# Patient Record
Sex: Female | Born: 1965 | Hispanic: No | Marital: Married | Smoking: Never smoker
Health system: Southern US, Community
[De-identification: ages and names within clinical notes are randomized; demographics above are authoritative.]

## PROBLEM LIST (undated history)

## (undated) DIAGNOSIS — I10 Essential (primary) hypertension: Secondary | ICD-10-CM

## (undated) DIAGNOSIS — R202 Paresthesia of skin: Secondary | ICD-10-CM

## (undated) DIAGNOSIS — R200 Anesthesia of skin: Secondary | ICD-10-CM

## (undated) HISTORY — DX: Anesthesia of skin: R20.2

## (undated) HISTORY — DX: Anesthesia of skin: R20.0

---

## 2001-05-15 HISTORY — PX: OTHER SURGICAL HISTORY: SHX169

## 2011-01-30 ENCOUNTER — Other Ambulatory Visit: Payer: Self-pay | Admitting: Family Medicine

## 2011-01-30 DIAGNOSIS — Z139 Encounter for screening, unspecified: Secondary | ICD-10-CM

## 2011-01-30 DIAGNOSIS — Z1231 Encounter for screening mammogram for malignant neoplasm of breast: Secondary | ICD-10-CM

## 2011-03-28 ENCOUNTER — Ambulatory Visit
Admission: RE | Admit: 2011-03-28 | Discharge: 2011-03-28 | Disposition: A | Discharge status: Home / Self Care | Payer: Managed Care, Other (non HMO) | Source: Ambulatory Visit | Attending: Family Medicine | Admitting: Family Medicine

## 2011-03-28 DIAGNOSIS — Z139 Encounter for screening, unspecified: Secondary | ICD-10-CM

## 2011-03-28 DIAGNOSIS — Z1231 Encounter for screening mammogram for malignant neoplasm of breast: Secondary | ICD-10-CM

## 2012-03-12 ENCOUNTER — Other Ambulatory Visit: Payer: Self-pay | Admitting: Family Medicine

## 2012-03-12 DIAGNOSIS — Z139 Encounter for screening, unspecified: Secondary | ICD-10-CM

## 2012-03-12 DIAGNOSIS — Z1231 Encounter for screening mammogram for malignant neoplasm of breast: Secondary | ICD-10-CM

## 2012-03-28 ENCOUNTER — Ambulatory Visit
Admission: RE | Admit: 2012-03-28 | Discharge: 2012-03-28 | Disposition: A | Discharge status: Home / Self Care | Payer: Managed Care, Other (non HMO) | Source: Ambulatory Visit | Attending: Family Medicine | Admitting: Family Medicine

## 2012-03-28 DIAGNOSIS — Z139 Encounter for screening, unspecified: Secondary | ICD-10-CM

## 2012-04-17 ENCOUNTER — Ambulatory Visit
Admission: RE | Admit: 2012-04-17 | Discharge: 2012-04-17 | Disposition: A | Discharge status: Home / Self Care | Payer: Managed Care, Other (non HMO) | Source: Ambulatory Visit | Attending: Family Medicine | Admitting: Family Medicine

## 2012-04-17 DIAGNOSIS — Z1231 Encounter for screening mammogram for malignant neoplasm of breast: Secondary | ICD-10-CM

## 2013-03-05 ENCOUNTER — Other Ambulatory Visit: Payer: Self-pay | Admitting: Internal Medicine

## 2013-03-05 DIAGNOSIS — Z0000 Encounter for general adult medical examination without abnormal findings: Secondary | ICD-10-CM

## 2013-03-07 ENCOUNTER — Other Ambulatory Visit: Payer: Self-pay | Admitting: Internal Medicine

## 2013-03-07 DIAGNOSIS — Z1231 Encounter for screening mammogram for malignant neoplasm of breast: Secondary | ICD-10-CM

## 2013-04-02 ENCOUNTER — Ambulatory Visit
Admission: RE | Admit: 2013-04-02 | Discharge: 2013-04-02 | Disposition: A | Discharge status: Home / Self Care | Payer: Managed Care, Other (non HMO) | Source: Ambulatory Visit | Attending: Internal Medicine | Admitting: Internal Medicine

## 2013-04-02 DIAGNOSIS — Z0000 Encounter for general adult medical examination without abnormal findings: Secondary | ICD-10-CM

## 2013-04-18 ENCOUNTER — Ambulatory Visit
Admission: RE | Admit: 2013-04-18 | Discharge: 2013-04-18 | Disposition: A | Discharge status: Home / Self Care | Payer: Managed Care, Other (non HMO) | Source: Ambulatory Visit | Attending: Internal Medicine | Admitting: Internal Medicine

## 2013-04-18 DIAGNOSIS — Z1231 Encounter for screening mammogram for malignant neoplasm of breast: Secondary | ICD-10-CM

## 2013-11-09 IMAGING — RF DG ESOPHAGUS
9 series · 9 of 9 positions shown · non-contrast
Comparison: None.

CLINICAL DATA: Screening exam

ESOPHOGRAM/BARIUM SWALLOW
TECHNIQUE: Combined double contrast and single contrast
examination performed using effervescent crystals, thick barium
liquid, and thin barium liquid.
Fluoroscopy time:  1.3 minutes.

[Series 1: run · 1 of 1 slices shown (1 of 9)]
[im 1/1]
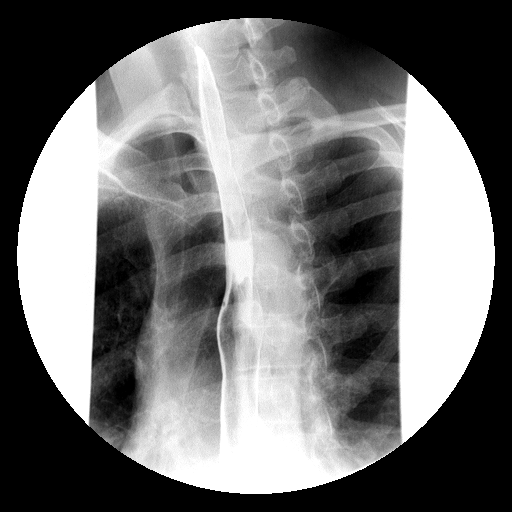

[Series 2: run · 1 of 1 slices shown (2 of 9)]
[im 1/1]
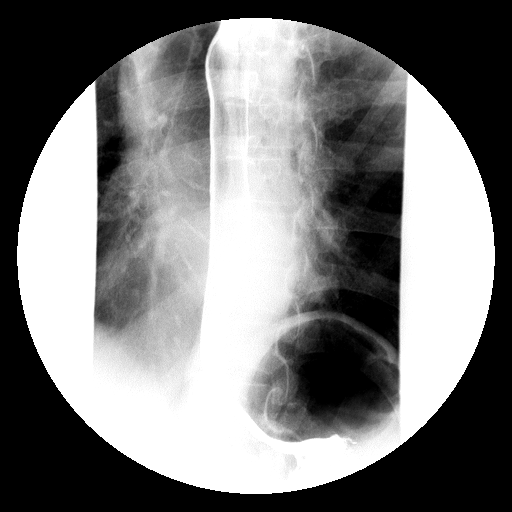

[Series 3: run · 1 of 1 slices shown (3 of 9)]
[im 1/1]
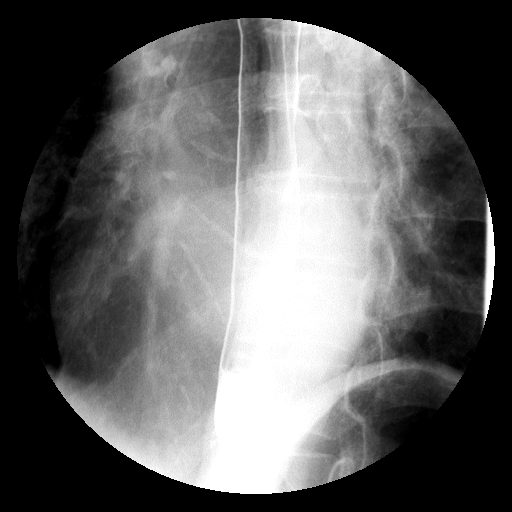

[Series 4: run · 1 of 1 slices shown (4 of 9)]
[im 1/1]
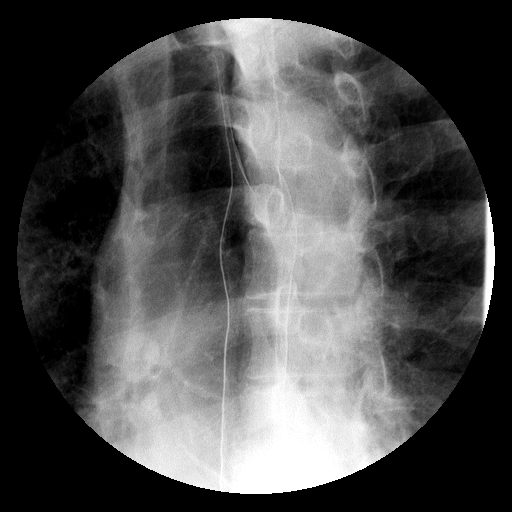

[Series 5: run · 1 of 1 slices shown (5 of 9)]
[im 1/1]
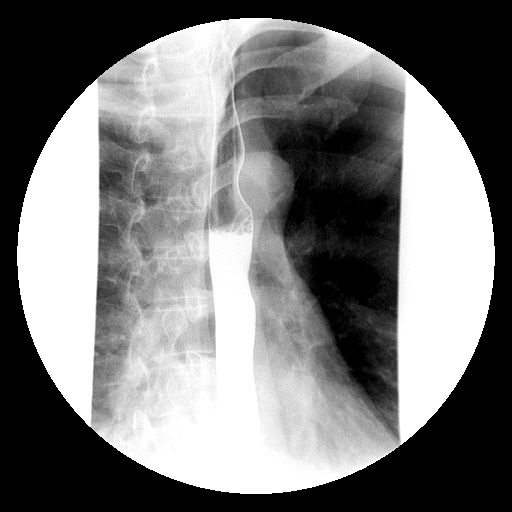

[Series 6: run · 1 of 1 slices shown (6 of 9)]
[im 1/1]
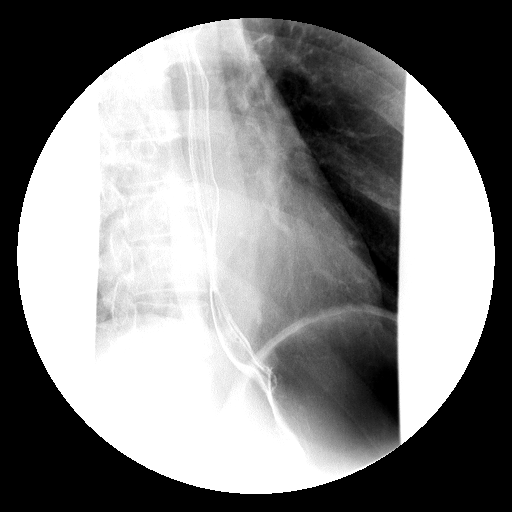

[Series 7: run · 1 of 1 slices shown (7 of 9)]
[im 1/1]
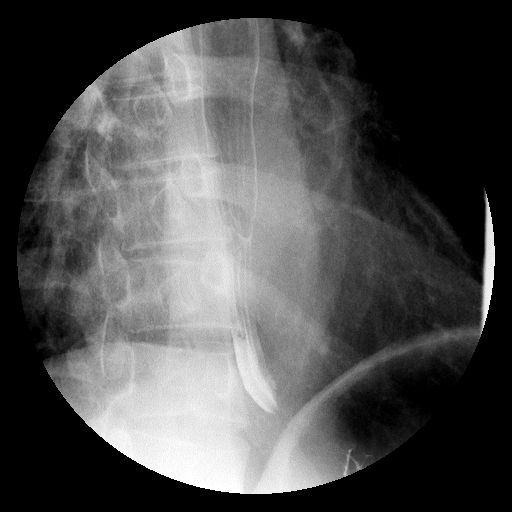

[Series 8: run · 1 of 1 slices shown (8 of 9)]
[im 1/1]
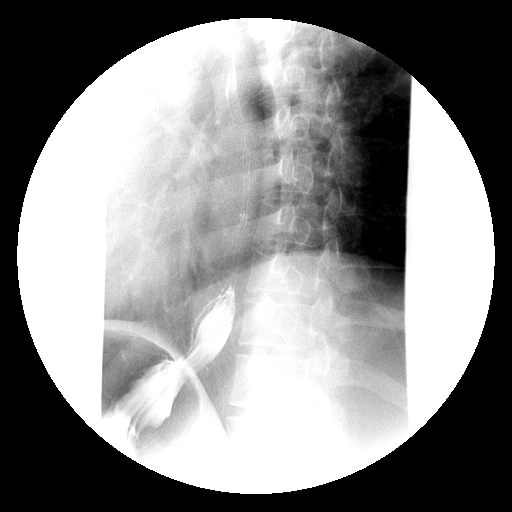

[Series 9: run · 1 of 1 slices shown (9 of 9)]
[im 1/1]
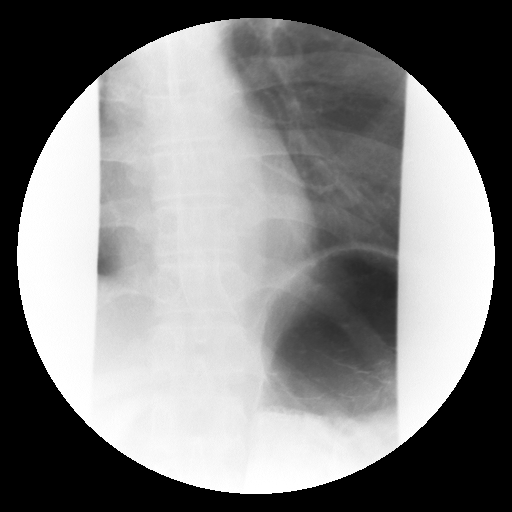

[9 of 9 positions shown; findings below may reference images not displayed]

FINDINGS: No mucosal irregularity within the thoracic esophagus or
distal esophagus.  No stricture or mass at the gastroesophageal
junction.  Normal esophageal motility.  No gastroesophageal reflux.
No hiatal hernia.  13 mm barium tablet passed GE junction easily.
IMPRESSION: Normal esophagram.

## 2014-03-11 ENCOUNTER — Other Ambulatory Visit: Payer: Self-pay | Admitting: Family Medicine

## 2014-03-11 DIAGNOSIS — Z0000 Encounter for general adult medical examination without abnormal findings: Secondary | ICD-10-CM

## 2014-03-11 DIAGNOSIS — Z1231 Encounter for screening mammogram for malignant neoplasm of breast: Secondary | ICD-10-CM

## 2014-03-20 ENCOUNTER — Other Ambulatory Visit: Payer: Self-pay | Admitting: Family Medicine

## 2014-03-20 DIAGNOSIS — M8580 Other specified disorders of bone density and structure, unspecified site: Secondary | ICD-10-CM

## 2014-04-21 ENCOUNTER — Other Ambulatory Visit: Payer: Managed Care, Other (non HMO)

## 2014-04-22 ENCOUNTER — Other Ambulatory Visit: Payer: Managed Care, Other (non HMO)

## 2014-04-22 ENCOUNTER — Ambulatory Visit: Payer: Managed Care, Other (non HMO)

## 2014-06-08 ENCOUNTER — Other Ambulatory Visit: Payer: Managed Care, Other (non HMO)

## 2014-06-09 ENCOUNTER — Ambulatory Visit: Payer: Managed Care, Other (non HMO)

## 2014-06-09 ENCOUNTER — Other Ambulatory Visit: Payer: Managed Care, Other (non HMO)

## 2014-06-24 ENCOUNTER — Ambulatory Visit
Admission: RE | Admit: 2014-06-24 | Discharge: 2014-06-24 | Disposition: A | Discharge status: Home / Self Care | Payer: Managed Care, Other (non HMO) | Source: Ambulatory Visit | Attending: Family Medicine | Admitting: Family Medicine

## 2014-06-24 DIAGNOSIS — Z0000 Encounter for general adult medical examination without abnormal findings: Secondary | ICD-10-CM

## 2014-06-26 ENCOUNTER — Other Ambulatory Visit: Payer: Self-pay | Admitting: Family Medicine

## 2014-07-06 ENCOUNTER — Other Ambulatory Visit: Payer: Self-pay | Admitting: Family Medicine

## 2014-07-06 DIAGNOSIS — M8580 Other specified disorders of bone density and structure, unspecified site: Secondary | ICD-10-CM

## 2014-07-08 ENCOUNTER — Ambulatory Visit
Admission: RE | Admit: 2014-07-08 | Discharge: 2014-07-08 | Disposition: A | Discharge status: Home / Self Care | Payer: 59 | Source: Ambulatory Visit | Attending: Family Medicine | Admitting: Family Medicine

## 2014-07-08 DIAGNOSIS — Z1231 Encounter for screening mammogram for malignant neoplasm of breast: Secondary | ICD-10-CM

## 2014-07-08 DIAGNOSIS — M8580 Other specified disorders of bone density and structure, unspecified site: Secondary | ICD-10-CM

## 2015-06-07 ENCOUNTER — Other Ambulatory Visit: Payer: Self-pay

## 2015-06-07 ENCOUNTER — Other Ambulatory Visit: Payer: Self-pay | Admitting: Family Medicine

## 2015-06-07 DIAGNOSIS — Z1231 Encounter for screening mammogram for malignant neoplasm of breast: Secondary | ICD-10-CM

## 2015-06-07 DIAGNOSIS — Z85028 Personal history of other malignant neoplasm of stomach: Secondary | ICD-10-CM

## 2015-06-23 ENCOUNTER — Other Ambulatory Visit: Payer: 59

## 2015-07-14 ENCOUNTER — Ambulatory Visit
Admission: RE | Admit: 2015-07-14 | Discharge: 2015-07-14 | Disposition: A | Discharge status: Home / Self Care | Payer: 59 | Source: Ambulatory Visit

## 2015-07-14 DIAGNOSIS — Z1231 Encounter for screening mammogram for malignant neoplasm of breast: Secondary | ICD-10-CM

## 2015-07-21 ENCOUNTER — Ambulatory Visit
Admission: RE | Admit: 2015-07-21 | Discharge: 2015-07-21 | Disposition: A | Discharge status: Home / Self Care | Payer: 59 | Source: Ambulatory Visit | Attending: Family Medicine | Admitting: Family Medicine

## 2015-07-21 DIAGNOSIS — Z85028 Personal history of other malignant neoplasm of stomach: Secondary | ICD-10-CM

## 2015-09-22 ENCOUNTER — Emergency Department (HOSPITAL_COMMUNITY): Payer: Managed Care, Other (non HMO)

## 2015-09-22 ENCOUNTER — Emergency Department (HOSPITAL_COMMUNITY)
Admission: EM | Admit: 2015-09-22 | Discharge: 2015-09-22 | Disposition: A | Discharge status: Home / Self Care | Payer: Managed Care, Other (non HMO) | Attending: Emergency Medicine | Admitting: Emergency Medicine

## 2015-09-22 ENCOUNTER — Encounter (HOSPITAL_COMMUNITY): Payer: Self-pay | Admitting: Emergency Medicine

## 2015-09-22 DIAGNOSIS — M79601 Pain in right arm: Secondary | ICD-10-CM

## 2015-09-22 DIAGNOSIS — R51 Headache: Secondary | ICD-10-CM | POA: Diagnosis present

## 2015-09-22 DIAGNOSIS — R202 Paresthesia of skin: Secondary | ICD-10-CM

## 2015-09-22 DIAGNOSIS — M79621 Pain in right upper arm: Secondary | ICD-10-CM | POA: Diagnosis not present

## 2015-09-22 DIAGNOSIS — M79622 Pain in left upper arm: Secondary | ICD-10-CM | POA: Insufficient documentation

## 2015-09-22 DIAGNOSIS — R519 Headache, unspecified: Secondary | ICD-10-CM

## 2015-09-22 DIAGNOSIS — M79602 Pain in left arm: Secondary | ICD-10-CM

## 2015-09-22 DIAGNOSIS — I10 Essential (primary) hypertension: Secondary | ICD-10-CM | POA: Diagnosis not present

## 2015-09-22 DIAGNOSIS — R209 Unspecified disturbances of skin sensation: Secondary | ICD-10-CM | POA: Insufficient documentation

## 2015-09-22 DIAGNOSIS — Z0389 Encounter for observation for other suspected diseases and conditions ruled out: Secondary | ICD-10-CM | POA: Diagnosis not present

## 2015-09-22 HISTORY — DX: Essential (primary) hypertension: I10

## 2015-09-22 LAB — COMPREHENSIVE METABOLIC PANEL
ALT: 11 U/L — ABNORMAL LOW (ref 14–54)
AST: 15 U/L (ref 15–41)
Albumin: 4.4 g/dL (ref 3.5–5.0)
Alkaline Phosphatase: 43 U/L (ref 38–126)
Anion gap: 10 (ref 5–15)
BUN: 10 mg/dL (ref 6–20)
CHLORIDE: 104 mmol/L (ref 101–111)
CO2: 28 mmol/L (ref 22–32)
Calcium: 9 mg/dL (ref 8.9–10.3)
Creatinine, Ser: 0.57 mg/dL (ref 0.44–1.00)
Glucose, Bld: 101 mg/dL — ABNORMAL HIGH (ref 65–99)
POTASSIUM: 3.8 mmol/L (ref 3.5–5.1)
Sodium: 142 mmol/L (ref 135–145)
Total Bilirubin: 1.3 mg/dL — ABNORMAL HIGH (ref 0.3–1.2)
Total Protein: 6.6 g/dL (ref 6.5–8.1)

## 2015-09-22 LAB — URINALYSIS, ROUTINE W REFLEX MICROSCOPIC
Bilirubin Urine: NEGATIVE
Glucose, UA: NEGATIVE mg/dL
HGB URINE DIPSTICK: NEGATIVE
KETONES UR: NEGATIVE mg/dL
Leukocytes, UA: NEGATIVE
Nitrite: NEGATIVE
PROTEIN: NEGATIVE mg/dL
Specific Gravity, Urine: 1.004 — ABNORMAL LOW (ref 1.005–1.030)
pH: 7.5 (ref 5.0–8.0)

## 2015-09-22 LAB — CBC
HEMATOCRIT: 36.9 % (ref 36.0–46.0)
Hemoglobin: 12.8 g/dL (ref 12.0–15.0)
MCH: 31.2 pg (ref 26.0–34.0)
MCHC: 34.7 g/dL (ref 30.0–36.0)
MCV: 90 fL (ref 78.0–100.0)
Platelets: 250 10*3/uL (ref 150–400)
RBC: 4.1 MIL/uL (ref 3.87–5.11)
RDW: 13.3 % (ref 11.5–15.5)
WBC: 4.8 10*3/uL (ref 4.0–10.5)

## 2015-09-22 LAB — PROTIME-INR
INR: 1.03 (ref 0.00–1.49)
PROTHROMBIN TIME: 13.7 s (ref 11.6–15.2)

## 2015-09-22 LAB — I-STAT CHEM 8, ED
BUN: 8 mg/dL (ref 6–20)
CALCIUM ION: 1.14 mmol/L (ref 1.12–1.23)
CHLORIDE: 100 mmol/L — AB (ref 101–111)
Creatinine, Ser: 0.5 mg/dL (ref 0.44–1.00)
Glucose, Bld: 93 mg/dL (ref 65–99)
HCT: 42 % (ref 36.0–46.0)
HEMOGLOBIN: 14.3 g/dL (ref 12.0–15.0)
Potassium: 3.7 mmol/L (ref 3.5–5.1)
SODIUM: 140 mmol/L (ref 135–145)
TCO2: 26 mmol/L (ref 0–100)

## 2015-09-22 LAB — DIFFERENTIAL
BASOS ABS: 0.1 10*3/uL (ref 0.0–0.1)
BASOS PCT: 1 %
EOS ABS: 0.2 10*3/uL (ref 0.0–0.7)
Eosinophils Relative: 4 %
Lymphocytes Relative: 30 %
Lymphs Abs: 1.5 10*3/uL (ref 0.7–4.0)
MONOS PCT: 8 %
Monocytes Absolute: 0.4 10*3/uL (ref 0.1–1.0)
Neutro Abs: 2.8 10*3/uL (ref 1.7–7.7)
Neutrophils Relative %: 57 %

## 2015-09-22 LAB — I-STAT TROPONIN, ED: TROPONIN I, POC: 0 ng/mL (ref 0.00–0.08)

## 2015-09-22 LAB — ETHANOL

## 2015-09-22 LAB — RAPID URINE DRUG SCREEN, HOSP PERFORMED
Amphetamines: NOT DETECTED
BARBITURATES: NOT DETECTED
BENZODIAZEPINES: NOT DETECTED
Cocaine: NOT DETECTED
OPIATES: NOT DETECTED
TETRAHYDROCANNABINOL: NOT DETECTED

## 2015-09-22 LAB — APTT: aPTT: 33 seconds (ref 24–37)

## 2015-09-22 MED ORDER — ACETAMINOPHEN 325 MG PO TABS
650.0000 mg | ORAL_TABLET | Freq: Once | ORAL | Status: AC
  Administered 2015-09-22: 650 mg via ORAL
  Filled 2015-09-22: qty 2

## 2015-09-22 NOTE — ED Notes (Signed)
Patient transported to MRI 

## 2015-09-22 NOTE — ED Notes (Signed)
Pt reports HA for past week associated with tingling in bilateral hands and legs. Tingling worse in R leg. Also having extremity weakness. Sent over from PCP for eval. No known head injuries.

## 2015-09-22 NOTE — ED Notes (Signed)
Patient ambulated from triage to room without difficulty. 

## 2015-09-22 NOTE — ED Provider Notes (Signed)
CSN: [id number]     Arrival date & time 09/22/15  1532 History   First MD Initiated Contact with Patient 09/22/15 1612     Chief Complaint  Patient presents with  . Headache  . Extremity Weakness     (Consider location/radiation/quality/duration/timing/severity/associated sxs/prior Treatment) HPI Comments: Patient presents today from the office of her PCP to rule out CVA.  Patient reports that she has had a headache for the past 3-4 days.  Headache has been constant and is diffuse.  Headache gradual in onset.  She has been having intermittent headaches for the past month.  She states that she has been taking Tylenol for the headache, which helps temporarily.  She reports having similar headaches in the past.  She also reports that she has been having some tingling of both feet and also both hands over the past four days.  Tingling is worse on the right foot than on the left.  She reports a history of headaches, but states that she has never had tingling like this before with previous headaches.  She denies any acute head injury or trauma.  She also reports that she had some generalized weakness two days ago, but denies weakness at this time.  Denies any fever, chills, vision changes, difficulty swallowing, confusion, facial asymmetry, dizziness, syncope, back pain, neck pain, chest pain, or any other symptoms at this time.  No history of CVA or TIA.  She does have a history of HTN.  No history of Hyperlipidemia or DM.  She does not smoke.  No family history of CVA.  The history is provided by the patient.    Past Medical History  Diagnosis Date  . Hypertension    History reviewed. No pertinent past surgical history. History reviewed. No pertinent family history. Social History  Substance Use Topics  . Smoking status: Never Smoker   . Smokeless tobacco: None  . Alcohol Use: No   OB History    No data available     Review of Systems  All other systems reviewed and are  negative.     Allergies  Review of patient's allergies indicates no known allergies.  Home Medications   Prior to Admission medications   Not on File   BP 161/101 mmHg  Pulse 70  Temp(Src) 98.2 F (36.8 C) (Oral)  Resp 16  Ht 5' (1.524 m)  Wt 58 kg  BMI 24.97 kg/m2  SpO2 96% Physical Exam  Constitutional: She appears well-developed and well-nourished.  HENT:  Head: Normocephalic and atraumatic.  Mouth/Throat: Oropharynx is clear and moist.  Eyes: EOM are normal. Pupils are equal, round, and reactive to light.  Neck: Normal range of motion. Neck supple.  Cardiovascular: Normal rate, regular rhythm and normal heart sounds.   Pulmonary/Chest: Effort normal and breath sounds normal.  Musculoskeletal: Normal range of motion.  Neurological: She is alert. She has normal strength. No cranial nerve deficit or sensory deficit. Coordination and gait normal.  Normal finger to nose testing, no dysmetria Normal sensation of upper and lower extremities Normal gait, no ataxia  Skin: Skin is warm and dry.  Psychiatric: She has a normal mood and affect.  Nursing note and vitals reviewed.   ED Course  Procedures (including critical care time) Labs Review Labs Reviewed  ETHANOL  PROTIME-INR  APTT  CBC  DIFFERENTIAL  COMPREHENSIVE METABOLIC PANEL  URINE RAPID DRUG SCREEN, HOSP PERFORMED  URINALYSIS, ROUTINE W REFLEX MICROSCOPIC (NOT AT [facility])  I-STAT CHEM 8, ED  Rosezena SensorI-STAT TROPOININ, ED  Imaging Review Ct Head Wo Contrast  09/22/2015  CLINICAL DATA:  Headache for approximately 1 week with tingling in both hands and legs. Initial encounter. EXAM: CT HEAD WITHOUT CONTRAST TECHNIQUE: Contiguous axial images were obtained from the base of the skull through the vertex without intravenous contrast. COMPARISON:  None. FINDINGS: The brain appears normal without hemorrhage, infarct, mass lesion, mass effect, midline shift or abnormal extra-axial fluid collection. No hydrocephalus or  pneumocephalus. Benign osteoma right frontal calvarium is noted. The calvarium is intact. Imaged paranasal sinuses and mastoid air cells are clear. IMPRESSION: Negative head CT. Electronically Signed   By: Drusilla Kanner M.D.   On: 09/22/2015 17:18   Mr Brain Wo Contrast  09/22/2015  CLINICAL DATA:  Left-sided weakness. Headache with tingling in both hands in legs. Tingling is worse on the right lower extremity. Right-sided numbness. EXAM: MRI HEAD WITHOUT CONTRAST TECHNIQUE: Multiplanar, multiecho pulse sequences of the brain and surrounding structures were obtained without intravenous contrast. COMPARISON:  CT head without contrast from the same day. FINDINGS: The diffusion-weighted images demonstrate no evidence for acute or subacute infarction. No acute hemorrhage or mass lesion is present. No significant white matter disease is present. The internal auditory canals are within normal limits bilaterally. The brainstem and cerebellum are normal. Flow is present in the major intracranial arteries. The globes and orbits are intact. A fluid level is present inferiorly in the left maxillary sinus. Mucosal thickening is present bilaterally. A calcified 1.5 cm right frontal scalp lesion likely represents a sebaceous cyst. The skullbase is within normal limits. Midline sagittal images are unremarkable. IMPRESSION: 1. Normal MRI appearance of the brain. 2. 1.5 cm right frontal scalp lesion likely reflecting a sebaceous cyst. Electronically Signed   By: Marin Roberts M.D.   On: 09/22/2015 18:40   I have personally reviewed and evaluated these images and lab results as part of my medical decision-making.   EKG Interpretation None     7:05 PM Patient reports that her headache resolved after given the Tylenol. MDM   Final diagnoses:  None   Patient presents today with complaint of headache and also numbness/tingling of extremities, worse on the right.  She was seen by PCP earlier today and sent to  the ED to rule out a CVA.  Normal neurological exam at this time.  Labs unremarkable.  CT head is negative. MRI is also negative.  Headache resolved in the ED after given Tylenol.  Feel that the patient is stable for discharge.  Return precautions given.    Santiago Glad, PA-C 09/22/15 2300  Santiago Glad, PA-C 09/22/15 1610  Bethann Berkshire, MD 09/23/15 [phone]

## 2015-09-22 NOTE — ED Notes (Signed)
PCP office called to alert us that they were sending over a patient. Headache, blurred vision, numbness, tingling x 1 week. Hx of HTN, does not take medication. Rule out CVA, alerted triage nurse.

## 2015-09-22 NOTE — Progress Notes (Signed)
Confirmed pcp seen today was Dr Pearson GrippeJames Kim  EPIC updated

## 2015-09-22 NOTE — ED Notes (Signed)
Patient aware of urine sample. Patient states she cannot void at this time.

## 2015-10-20 ENCOUNTER — Encounter: Payer: Self-pay | Admitting: Neurology

## 2015-10-20 ENCOUNTER — Ambulatory Visit (INDEPENDENT_AMBULATORY_CARE_PROVIDER_SITE_OTHER): Payer: Managed Care, Other (non HMO) | Admitting: Neurology

## 2015-10-20 VITALS — BP 135/91 | HR 69 | Ht 60.0 in | Wt 127.8 lb

## 2015-10-20 DIAGNOSIS — M25541 Pain in joints of right hand: Secondary | ICD-10-CM | POA: Diagnosis not present

## 2015-10-20 DIAGNOSIS — R202 Paresthesia of skin: Secondary | ICD-10-CM | POA: Insufficient documentation

## 2015-10-20 MED ORDER — DICLOFENAC SODIUM 1 % TD GEL: 4.0000 g | Freq: Four times a day (QID) | TRANSDERMAL | Status: AC

## 2015-10-20 NOTE — Progress Notes (Signed)
PATIENT: Colleen Hawkins DOB: 1965-10-11  Chief Complaint  Patient presents with  . Numbness    She is concerned about intermittent numbness, tingling and pain in her bilateral arms, hands and several fingers (right side worse than left).  She has also experienced numbness in her legs.     HISTORICAL  Colleen Hawkins  Is a 50 years right-handed female, seen in refer by her primary care physician Dr. Pearson Grippe for evaluation of intermittent bilateral hands and arm paresthesia.  She had a history of hypertension, currently is a housewife, also take an like courses, enjoy playing piano.  She first noticed right ring finger pain around April 2017, she woke up notice right fourth metaphalangeal joints tenderness, difficult to make a tight grip, she also noticed intermittent first 3 fingertips paresthesia, right wrist stiffness, achy pain  She also has mild right-sided neck pain, right shoulder pain, subjective weakness, she denies left arm involvement, no gait abnormality.  Most recent laboratory evaluations in May 2017 from [facility], CBC, CMP, lipid panel were normal.   REVIEW OF SYSTEMS: Full 14 system review of systems performed and notable only for as above  ALLERGIES: No Known Allergies  HOME MEDICATIONS: Current Outpatient Prescriptions  Medication Sig Dispense Refill  . acetaminophen (TYLENOL) 500 MG tablet Take 1,000 mg by mouth every 6 (six) hours as needed for headache.     No current facility-administered medications for this visit.    PAST MEDICAL HISTORY: Past Medical History  Diagnosis Date  . Hypertension   . Numbness and tingling     PAST SURGICAL HISTORY: Past Surgical History  Procedure Laterality Date  . Partial removal of cystoma on ovary  2003    FAMILY HISTORY: Family History  Problem Relation Age of Onset  . Bladder Cancer Father   . Hypertension Mother     SOCIAL HISTORY:  Social History   Social History  .  Marital Status: Married    Spouse Name: N/A  . Number of Children: 1  . Years of Education: Bachelors   Occupational History  . Homemaker    Social History Main Topics  . Smoking status: Never Smoker   . Smokeless tobacco: Not on file  . Alcohol Use: No  . Drug Use: Not on file  . Sexual Activity: Not on file   Other Topics Concern  . Not on file   Social History Narrative   Lives at home with husband and son.   Right-handed.   2 cups caffeine per day.     PHYSICAL EXAM   Filed Vitals:   10/20/15 0809  BP: 135/91  Pulse: 69  Height: 5' (1.524 m)  Weight: 127 lb 12 oz (57.947 kg)    Not recorded      Body mass index is 24.95 kg/(m^2).  PHYSICAL EXAMNIATION:  Gen: NAD, conversant, well nourised, obese, well groomed                     Cardiovascular: Regular rate rhythm, no peripheral edema, warm, nontender. Eyes: Conjunctivae clear without exudates or hemorrhage Neck: Supple, no carotid bruise. Pulmonary: Clear to auscultation bilaterally   NEUROLOGICAL EXAM:  MENTAL STATUS: Speech:    Speech is normal; fluent and spontaneous with normal comprehension.  Cognition:     Orientation to time, place and person     Normal recent and remote memory     Normal Attention span and concentration     Normal Language, naming, repeating,spontaneous speech  Fund of knowledge   CRANIAL NERVES: CN II: Visual fields are full to confrontation. Fundoscopic exam is normal with sharp discs and no vascular changes. Pupils are round equal and briskly reactive to light. CN III, IV, VI: extraocular movement are normal. No ptosis. CN V: Facial sensation is intact to pinprick in all 3 divisions bilaterally. Corneal responses are intact.  CN VII: Face is symmetric with normal eye closure and smile. CN VIII: Hearing is normal to rubbing fingers CN IX, X: Palate elevates symmetrically. Phonation is normal. CN XI: Head turning and shoulder shrug are intact CN XII: Tongue is  midline with normal movements and no atrophy.  MOTOR: There is no pronator drift of out-stretched arms. Muscle bulk and tone are normal. Muscle strength is normal.  REFLEXES: Reflexes are 2+ and symmetric at the biceps, triceps, knees, and ankles. Plantar responses are flexor.  SENSORY: Intact to light touch, pinprick, positional sensation and vibratory sensation are intact in fingers and toes, with exception of decreased pinprick at right first 3 fingerpads.  COORDINATION: Rapid alternating movements and fine finger movements are intact. There is no dysmetria on finger-to-nose and heel-knee-shin.    GAIT/STANCE: Posture is normal. Gait is steady with normal steps, base, arm swing, and turning. Heel and toe walking are normal. Tandem gait is normal.  Romberg is absent.   DIAGNOSTIC DATA (LABS, IMAGING, TESTING) - I reviewed patient records, labs, notes, testing and imaging myself where available.   ASSESSMENT AND PLAN  Colleen Hawkins is a 50 y.o. female   Right fourth metaphalangeal joints pain, tenderness upon deep palpitation, consistent with a joint problem,  I have suggested Voltaren gel, right hand splint, heating pad Right hand paresthesia  Likely mild right carpal tunnel syndrome  If her symptoms continued to get worse, may consider EMG nerve conduction study  Colleen Hawkins, M.D. Ph.D.  [facility] [address], [address] [place], [place] [address] Ph: [phone] Fax: [phone]  CC: Colleen GrippeJames Kim, MD

## 2015-10-20 NOTE — Patient Instructions (Signed)
Wear right wrist splint  Voltaren Gel as need to right hand joint   Heating pad.

## 2016-07-21 ENCOUNTER — Other Ambulatory Visit: Payer: Self-pay | Admitting: Family Medicine

## 2016-07-21 DIAGNOSIS — Z9189 Other specified personal risk factors, not elsewhere classified: Secondary | ICD-10-CM

## 2016-07-26 ENCOUNTER — Other Ambulatory Visit: Payer: Self-pay | Admitting: Family Medicine

## 2016-07-26 DIAGNOSIS — Z1231 Encounter for screening mammogram for malignant neoplasm of breast: Secondary | ICD-10-CM

## 2016-07-28 ENCOUNTER — Ambulatory Visit
Admission: RE | Admit: 2016-07-28 | Discharge: 2016-07-28 | Disposition: A | Discharge status: Home / Self Care | Payer: Managed Care, Other (non HMO) | Source: Ambulatory Visit | Attending: Family Medicine | Admitting: Family Medicine

## 2016-07-28 DIAGNOSIS — Z1231 Encounter for screening mammogram for malignant neoplasm of breast: Secondary | ICD-10-CM

## 2016-08-03 ENCOUNTER — Other Ambulatory Visit: Payer: 59

## 2016-08-22 ENCOUNTER — Ambulatory Visit
Admission: RE | Admit: 2016-08-22 | Discharge: 2016-08-22 | Disposition: A | Discharge status: Home / Self Care | Payer: Managed Care, Other (non HMO) | Source: Ambulatory Visit | Attending: Family Medicine | Admitting: Family Medicine

## 2016-08-22 DIAGNOSIS — Z9189 Other specified personal risk factors, not elsewhere classified: Secondary | ICD-10-CM

## 2021-03-08 ENCOUNTER — Ambulatory Visit (INDEPENDENT_AMBULATORY_CARE_PROVIDER_SITE_OTHER): Payer: 59 | Admitting: Podiatry

## 2021-03-08 ENCOUNTER — Other Ambulatory Visit: Payer: Self-pay

## 2021-03-08 VITALS — BP 140/84 | HR 65 | Temp 97.7°F

## 2021-03-08 DIAGNOSIS — S90222A Contusion of left lesser toe(s) with damage to nail, initial encounter: Secondary | ICD-10-CM | POA: Diagnosis not present

## 2021-03-08 DIAGNOSIS — B351 Tinea unguium: Secondary | ICD-10-CM | POA: Diagnosis not present

## 2021-03-08 NOTE — Patient Instructions (Signed)

## 2021-03-11 NOTE — Progress Notes (Signed)
Subjective:   Patient ID: Colleen Hawkins, female   DOB: 55 y.o.   MRN: [id number]   HPI 55 year old female presents the office today for concerns of discoloration to her left second third digit toenail which is been ongoing for last couple weeks.  She does state she had to play tennis but she does not recall specific injury.  She has no pain in the toenails and she denies any swelling or redness or any drainage.  She has no other concerns today.  No recent treatment.   Review of Systems  All other systems reviewed and are negative.  Past Medical History:  Diagnosis Date   Hypertension    Numbness and tingling     Past Surgical History:  Procedure Laterality Date   Partial removal of cystoma on ovary  2003     Current Outpatient Medications:    tamsulosin (FLOMAX) 0.4 MG CAPS capsule, Take by mouth., Disp: , Rfl:    acetaminophen (TYLENOL) 500 MG tablet, Take 1,000 mg by mouth every 6 (six) hours as needed for headache., Disp: , Rfl:    diclofenac sodium (VOLTAREN) 1 % GEL, Apply 4 g topically 4 (four) times daily., Disp: 100 g, Rfl: 6   lisinopril (ZESTRIL) 2.5 MG tablet, lisinopril 2.5 mg tablet (Patient not taking: Reported on 03/08/2021), Disp: , Rfl:   No Known Allergies        Objective:  Physical Exam  General: AAO x3, NAD  Dermatological: Left second third digit toenail appears to be hypertrophic, dystrophic.  The nails are starting to loosen up in the distal portion.  There is some dried blood and discoloration but upon debridement there is appear to be several hematoma as able to debride some of the dried blood.  There is no sensory hyperpigmentation surrounding skin or underlying nailbed.  No edema, erythema to the pin sites.  No open lesions.     Vascular: Dorsalis Pedis artery and Posterior Tibial artery pedal pulses are 2/4 bilateral with immedate capillary fill time. There is no pain with calf compression, swelling, warmth, erythema.   Neruologic: Grossly  intact via light touch bilateral.  Musculoskeletal: No significant tenderness to palpation on exam today.  Muscular strength 5/5 in all groups tested bilateral.  Gait: Unassisted, Nonantalgic.       Assessment:   55 year old female with left onychodystrophy, subungual hematoma toenail     Plan:  -Treatment options discussed including all alternatives, risks, and complications -Etiology of symptoms were discussed -Patient debrided the left second and third digit toenails with any complications and sent them for culture, pathology to [facility] labs.  It did appear to be cephalhematoma and is able to remove some of this today during debridement.  I discussed Epson salt soaks and monitoring signs or symptoms of infection.  If there is any worsening discussed further biopsy/removal of nail but hopefully we can avoid this.  Vivi Barrack DPM

## 2021-03-16 ENCOUNTER — Telehealth: Payer: Self-pay | Admitting: *Deleted

## 2021-03-16 NOTE — Telephone Encounter (Signed)
Called patient,no answer but was able to leave a vmessage of results and recommendations per Dr Ardelle Anton.

## 2021-03-18 ENCOUNTER — Telehealth: Payer: Self-pay | Admitting: *Deleted

## 2021-03-18 NOTE — Telephone Encounter (Signed)
Called and left a message for the patient and relayed the message per Dr Wagoner. Colleen Hawkins 

## 2021-03-18 NOTE — Telephone Encounter (Signed)
-----   Message from Vivi Barrack, DPM sent at 03/16/2021  3:53 PM EDT ----- Misty Stanley or Ammie- please let her know that the culture did not show fungus, but damage to the nail. I would recommend using "urea nail gel" to help with the thickening and routine debridements of the nail. The darkened spot should grow out over times. If it does not, or if it gets any worse, to let me know.
